# Patient Record
Sex: Female | Born: 1975
Health system: Southern US, Community
[De-identification: ages and names within clinical notes are randomized; demographics above are authoritative.]

## PROBLEM LIST (undated history)

## (undated) DIAGNOSIS — K219 Gastro-esophageal reflux disease without esophagitis: Secondary | ICD-10-CM

## (undated) DIAGNOSIS — D649 Anemia, unspecified: Secondary | ICD-10-CM

## (undated) DIAGNOSIS — R51 Headache: Secondary | ICD-10-CM

## (undated) HISTORY — PX: NASAL ENDOSCOPY: SHX286

---

## 2004-01-15 ENCOUNTER — Other Ambulatory Visit: Admission: RE | Admit: 2004-01-15 | Discharge: 2004-01-15 | Payer: Self-pay | Admitting: Gynecology

## 2004-08-06 ENCOUNTER — Inpatient Hospital Stay (HOSPITAL_COMMUNITY): Admission: AD | Admit: 2004-08-06 | Discharge: 2004-08-09 | Payer: Self-pay | Admitting: Gynecology

## 2004-08-07 ENCOUNTER — Encounter (INDEPENDENT_AMBULATORY_CARE_PROVIDER_SITE_OTHER): Payer: Self-pay | Admitting: *Deleted

## 2004-09-23 ENCOUNTER — Other Ambulatory Visit: Admission: RE | Admit: 2004-09-23 | Discharge: 2004-09-23 | Payer: Self-pay | Admitting: Gynecology

## 2005-09-29 ENCOUNTER — Other Ambulatory Visit: Admission: RE | Admit: 2005-09-29 | Discharge: 2005-09-29 | Payer: Self-pay | Admitting: Gynecology

## 2006-11-12 ENCOUNTER — Emergency Department (HOSPITAL_COMMUNITY): Admission: EM | Admit: 2006-11-12 | Discharge: 2006-11-12 | Payer: Self-pay | Admitting: Emergency Medicine

## 2007-09-23 ENCOUNTER — Inpatient Hospital Stay (HOSPITAL_COMMUNITY): Admission: AD | Admit: 2007-09-23 | Discharge: 2007-09-25 | Payer: Self-pay | Admitting: Obstetrics and Gynecology
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
## 2010-08-01 ENCOUNTER — Ambulatory Visit (HOSPITAL_COMMUNITY): Admission: RE | Admit: 2010-08-01 | Payer: Self-pay | Source: Ambulatory Visit | Admitting: Otolaryngology

## 2010-08-22 NOTE — H&P (Signed)
Peggy Sherman, Peggy Sherman             ACCOUNT NO.:  [id number]   MEDICAL RECORD NO.:  [id number]          PATIENT TYPE:  MAT   LOCATION:  [facility]                          FACILITY:  [facility]   PHYSICIAN:  Juan H. Lily Peer, M.D.DATE OF BIRTH:  June 13, 1975   DATE OF ADMISSION:  DATE OF DISCHARGE:                                HISTORY & PHYSICAL   CHIEF COMPLAINT:  Post date pregnancy, abdominal discomfort.   HISTORY:  The patient is a 35 year old gravida 1, para 0 with a last  menstrual period uncertain but with a corrected estimated date of  confinement August 02, 2004.  The patient currently 40 and 4/[redacted] week  gestation.  Was seen in the office today for her routine prenatal visit.  The patient quite anxious, complaining of abdominal discomfort and irregular  contractions.  Her cervix was 1 cm, 50% effaced, -3 station.  Review of her  prenatal history demonstrated she had an uneventful prenatal course with the  exception of iron deficiency anemia.  Had an abnormal diabetes screen but a  normal followup 3-hour GTT.   PAST MEDICAL HISTORY:  She DENIES ANY ALLERGIES, no smoking or alcohol  consumption, otherwise healthy, on prenatal vitamins.   REVIEW OF SYSTEMS:  __________.   PHYSICAL EXAM:  Her blood pressure today was 128/70, urine was negative for  protein and glucose, weight 183 pounds.  HEENT:  Unremarkable.  NECK:  Supple, trachea midline.  No carotid bruits, no thyromegaly.  LUNGS:  Clear to auscultation without rhonchi or wheezes.  HEART:  Regular rate and rhythm, no murmurs or gallops.  BREAST EXAM:  Not done.  ABDOMEN:  Soft, nontender.  Gravid uterus.  Vertex presentation by [facility]  maneuver.  PELVIC EXAM:  Cervix 1 cm, 50% effaced, -3 station.  EXTREMITIES:  DTR 1+, negative clonus, trace edema.   PRENATAL LABS:  O positive blood type, negative antibody screen.  VDRL was  nonreactive, Rubella immune.  Hepatitis B Surface Antigen and HIV were  negative.  Pap smear was normal.   Diabetes screen as described above.  Patient with iron deficiency anemia, hemoglobin was 11.7, and a GBS culture  was negative and Pap smear was normal.   ASSESSMENT:  A 35 year old gravida 1, para 0, at 63 and 4/[redacted] weeks gestation,  anxious and being post dates and having irregular contractions.  Patient  cervix 1 cm, 50% effaced, -3 station.  She was offered to come in later this  evening for evaluation and monitoring and possible consideration of  admission for induction, such as with Cervidil or pitocin.   PLAN:  As per assessment above.      JHF/MEDQ  D:  08/06/2004  T:  08/06/2004  Job:  [id number]

## 2011-01-01 LAB — CBC
Hemoglobin: 12.4
MCHC: 33.8
MCHC: 34.4
MCV: 91.2
MCV: 92.2
Platelets: 230
RBC: 3.91
RBC: 4.56
RDW: 12.9
WBC: 9.2

## 2011-01-01 LAB — RPR: RPR Ser Ql: NONREACTIVE

## 2011-01-19 LAB — POCT PREGNANCY, URINE: Operator id: 28425

## 2012-02-02 ENCOUNTER — Other Ambulatory Visit (HOSPITAL_COMMUNITY): Payer: Self-pay | Admitting: Otolaryngology

## 2012-03-07 ENCOUNTER — Encounter (HOSPITAL_COMMUNITY): Payer: Self-pay

## 2012-03-10 ENCOUNTER — Encounter (HOSPITAL_COMMUNITY)
Admission: RE | Admit: 2012-03-10 | Discharge: 2012-03-10 | Disposition: A | Discharge status: Home / Self Care | Payer: 59 | Source: Ambulatory Visit | Attending: Otolaryngology | Admitting: Otolaryngology

## 2012-03-10 ENCOUNTER — Encounter (HOSPITAL_COMMUNITY): Payer: Self-pay

## 2012-03-10 HISTORY — DX: Headache: R51

## 2012-03-10 HISTORY — DX: Gastro-esophageal reflux disease without esophagitis: K21.9

## 2012-03-10 HISTORY — DX: Anemia, unspecified: D64.9

## 2012-03-10 LAB — CBC
HCT: 37.7 % (ref 36.0–46.0)
Hemoglobin: 13.1 g/dL (ref 12.0–15.0)
MCH: 30 pg (ref 26.0–34.0)
MCHC: 34.7 g/dL (ref 30.0–36.0)
RBC: 4.36 MIL/uL (ref 3.87–5.11)

## 2012-03-10 NOTE — Pre-Procedure Instructions (Signed)
20 Peggy Sherman  03/10/2012   Your procedure is scheduled on:  Friday, December 13th  Report to [facility] at 0945 AM.  Call this number if you have problems the morning of surgery: [phone]   Remember:   Do not eat food or drink:After Midnight.   Take these medicines the morning of surgery with A SIP OF WATER: protonix   Do not wear jewelry, make-up or nail polish.  Do not wear lotions, powders, or perfumes.   Do not shave 48 hours prior to surgery. Men may shave face and neck.  Do not bring valuables to the hospital.  Contacts, dentures or bridgework may not be worn into surgery.  Leave suitcase in the car. After surgery it may be brought to your room.  For patients admitted to the hospital, checkout time is 11:00 AM the day of discharge.   Patients discharged the day of surgery will not be allowed to drive home.    Special Instructions: Shower using CHG 2 nights before surgery and the night before surgery.  If you shower the day of surgery use CHG.  Use special wash - you have one bottle of CHG for all showers.  You should use approximately 1/3 of the bottle for each shower.   Please read over the following fact sheets that you were given: Pain Booklet, Coughing and Deep Breathing, MRSA Information and Surgical Site Infection Prevention

## 2012-03-10 NOTE — Progress Notes (Signed)
Pt. Denies all resp. & cardiac problems, never seen by cardiologist.

## 2012-03-18 ENCOUNTER — Encounter (HOSPITAL_COMMUNITY)
Admission: RE | Disposition: A | Discharge status: Home / Self Care | Payer: Self-pay | Source: Ambulatory Visit | Attending: Otolaryngology

## 2012-03-18 ENCOUNTER — Ambulatory Visit (HOSPITAL_COMMUNITY): Payer: 59 | Admitting: Anesthesiology

## 2012-03-18 ENCOUNTER — Ambulatory Visit (HOSPITAL_COMMUNITY)
Admission: RE | Admit: 2012-03-18 | Discharge: 2012-03-18 | Disposition: A | Discharge status: Home / Self Care | Payer: 59 | Source: Ambulatory Visit | Attending: Otolaryngology | Admitting: Otolaryngology

## 2012-03-18 ENCOUNTER — Encounter (HOSPITAL_COMMUNITY): Payer: Self-pay | Admitting: Anesthesiology

## 2012-03-18 ENCOUNTER — Encounter (HOSPITAL_COMMUNITY): Payer: Self-pay | Admitting: *Deleted

## 2012-03-18 DIAGNOSIS — R490 Dysphonia: Secondary | ICD-10-CM | POA: Insufficient documentation

## 2012-03-18 DIAGNOSIS — K219 Gastro-esophageal reflux disease without esophagitis: Secondary | ICD-10-CM | POA: Insufficient documentation

## 2012-03-18 HISTORY — PX: LARYNGOSCOPY: SHX5203

## 2012-03-18 SURGERY — LARYNGOSCOPY
Anesthesia: General | Site: Throat | Wound class: Clean Contaminated

## 2012-03-18 MED ORDER — LIDOCAINE HCL 4 % MT SOLN
OROMUCOSAL | Status: DC | PRN
  Administered 2012-03-18: 4 mL via TOPICAL

## 2012-03-18 MED ORDER — SUCCINYLCHOLINE CHLORIDE 20 MG/ML IJ SOLN
250.0000 mg | INTRAVENOUS | Status: DC | PRN
  Administered 2012-03-18: 2.5 mg/min via INTRAVENOUS

## 2012-03-18 MED ORDER — PROPOFOL INFUSION 10 MG/ML OPTIME
INTRAVENOUS | Status: DC | PRN
  Administered 2012-03-18: 140 ug/kg/min via INTRAVENOUS

## 2012-03-18 MED ORDER — 0.9 % SODIUM CHLORIDE (POUR BTL) OPTIME
TOPICAL | Status: DC | PRN
  Administered 2012-03-18: 1000 mL

## 2012-03-18 MED ORDER — ONDANSETRON HCL 4 MG/2ML IJ SOLN
INTRAMUSCULAR | Status: DC | PRN
  Administered 2012-03-18: 4 mg via INTRAVENOUS

## 2012-03-18 MED ORDER — HYDROMORPHONE HCL PF 1 MG/ML IJ SOLN
INTRAMUSCULAR | Status: AC
  Filled 2012-03-18: qty 1

## 2012-03-18 MED ORDER — PROPOFOL 10 MG/ML IV BOLUS
INTRAVENOUS | Status: DC | PRN
  Administered 2012-03-18: 200 mg via INTRAVENOUS

## 2012-03-18 MED ORDER — OXYMETAZOLINE HCL 0.05 % NA SOLN
NASAL | Status: AC
  Filled 2012-03-18: qty 15

## 2012-03-18 MED ORDER — SUCCINYLCHOLINE CHLORIDE 20 MG/ML IJ SOLN
INTRAMUSCULAR | Status: DC | PRN
  Administered 2012-03-18: 100 mg via INTRAVENOUS

## 2012-03-18 MED ORDER — DEXAMETHASONE SODIUM PHOSPHATE 4 MG/ML IJ SOLN
INTRAMUSCULAR | Status: DC | PRN
  Administered 2012-03-18: 8 mg via INTRAVENOUS

## 2012-03-18 MED ORDER — LIDOCAINE HCL (CARDIAC) 20 MG/ML IV SOLN
INTRAVENOUS | Status: DC | PRN
  Administered 2012-03-18: 80 mg via INTRAVENOUS

## 2012-03-18 MED ORDER — EPINEPHRINE HCL (NASAL) 0.1 % NA SOLN
NASAL | Status: AC
  Filled 2012-03-18: qty 30

## 2012-03-18 MED ORDER — LACTATED RINGERS IV SOLN
INTRAVENOUS | Status: DC
  Administered 2012-03-18: 11:00:00 via INTRAVENOUS

## 2012-03-18 MED ORDER — HYDROMORPHONE HCL PF 1 MG/ML IJ SOLN
0.2500 mg | INTRAMUSCULAR | Status: DC | PRN
  Administered 2012-03-18: 0.5 mg via INTRAVENOUS

## 2012-03-18 MED ORDER — MIDAZOLAM HCL 5 MG/5ML IJ SOLN
INTRAMUSCULAR | Status: DC | PRN
  Administered 2012-03-18: 2 mg via INTRAVENOUS

## 2012-03-18 MED ORDER — HYDROCODONE-ACETAMINOPHEN 5-325 MG PO TABS: 1.0000 | ORAL_TABLET | Freq: Four times a day (QID) | ORAL | Status: DC | PRN

## 2012-03-18 SURGICAL SUPPLY — 26 items
BALLN PULM 15 16.5 18X75 (BALLOONS) ×2
BALLOON PULM 15 16.5 18X75 (BALLOONS) IMPLANT
BIT DRILL QC 2.7MM (BIT) IMPLANT
CANISTER SUCTION 2500CC (MISCELLANEOUS) ×2 IMPLANT
CLOTH BEACON ORANGE TIMEOUT ST (SAFETY) ×2 IMPLANT
COVER MAYO STAND STRL (DRAPES) ×2 IMPLANT
COVER TABLE BACK 60X90 (DRAPES) ×2 IMPLANT
CRADLE DONUT ADULT HEAD (MISCELLANEOUS) ×1 IMPLANT
DRAPE PROXIMA HALF (DRAPES) ×2 IMPLANT
GAUZE SPONGE 4X4 16PLY XRAY LF (GAUZE/BANDAGES/DRESSINGS) ×2 IMPLANT
GLOVE BIO SURGEON STRL SZ7.5 (GLOVE) ×2 IMPLANT
GLOVE ECLIPSE 6.5 STRL STRAW (GLOVE) ×1 IMPLANT
GLOVE SURG SS PI 6.5 STRL IVOR (GLOVE) ×1 IMPLANT
GUARD TEETH (MISCELLANEOUS) ×1 IMPLANT
KIT BASIN OR (CUSTOM PROCEDURE TRAY) ×2 IMPLANT
KIT PROLARN PLUS GEL W/NDL (Prosthesis and Implant ENT) ×1 IMPLANT
KIT ROOM TURNOVER OR (KITS) ×2 IMPLANT
NS IRRIG 1000ML POUR BTL (IV SOLUTION) ×2 IMPLANT
PAD ARMBOARD 7.5X6 YLW CONV (MISCELLANEOUS) ×4 IMPLANT
PATTIES SURGICAL .5 X.5 (GAUZE/BANDAGES/DRESSINGS) ×2 IMPLANT
PATTIES SURGICAL .5 X3 (DISPOSABLE) ×1 IMPLANT
PROS DRILL BIT QC 2.7MM (BIT) ×2
SYR INFLATE BILIARY GAUGE (MISCELLANEOUS) ×1 IMPLANT
TOWEL OR 17X24 6PK STRL BLUE (TOWEL DISPOSABLE) ×4 IMPLANT
TUBE CONNECTING 12X1/4 (SUCTIONS) ×2 IMPLANT
WATER STERILE IRR 1000ML POUR (IV SOLUTION) ×2 IMPLANT

## 2012-03-18 NOTE — Anesthesia Preprocedure Evaluation (Signed)
Anesthesia Evaluation  Patient identified by MRN, date of birth, ID band Patient awake    Reviewed: Allergy & Precautions, H&P , NPO status , Patient's Chart, lab work & pertinent test results  Airway Mallampati: II      Dental   Pulmonary neg pulmonary ROS,  breath sounds clear to auscultation        Cardiovascular negative cardio ROS  Rhythm:Regular Rate:Normal     Neuro/Psych    GI/Hepatic Neg liver ROS, GERD-  ,  Endo/Other  negative endocrine ROS  Renal/GU negative Renal ROS     Musculoskeletal   Abdominal   Peds  Hematology   Anesthesia Other Findings   Reproductive/Obstetrics                           Anesthesia Physical Anesthesia Plan  ASA: II  Anesthesia Plan: General   Post-op Pain Management:    Induction: Intravenous  Airway Management Planned: Oral ETT  Additional Equipment:   Intra-op Plan:   Post-operative Plan: Extubation in OR  Informed Consent:   Dental advisory given  Plan Discussed with: CRNA, Anesthesiologist and Surgeon  Anesthesia Plan Comments:         Anesthesia Quick Evaluation

## 2012-03-18 NOTE — Anesthesia Postprocedure Evaluation (Signed)
  Anesthesia Post-op Note  Patient: Peggy Sherman  Procedure(s) Performed: Procedure(s) (LRB) with comments: LARYNGOSCOPY (N/A) - Micro direct laryngoscopy with bilateral radiesse injections/jet venturi ventilation  Patient Location: PACU  Anesthesia Type:General  Level of Consciousness: awake  Airway and Oxygen Therapy: Patient Spontanous Breathing  Post-op Pain: mild  Post-op Assessment: Post-op Vital signs reviewed  Post-op Vital Signs: Reviewed  Complications: No apparent anesthesia complications

## 2012-03-18 NOTE — Brief Op Note (Signed)
03/18/2012  11:50 AM  PATIENT:  Peggy Sherman  36 y.o. female  PRE-OPERATIVE DIAGNOSIS:  Dysphonia  POST-OPERATIVE DIAGNOSIS:  Dysphonia  PROCEDURE:  Procedure(s) (LRB) with comments: LARYNGOSCOPY (N/A) - Micro direct laryngoscopy with bilateral radiesse injections/jet venturi ventilation  SURGEON:  Surgeon(s) and Role:    * Christia Reading, MD - Primary  PHYSICIAN ASSISTANT:   ASSISTANTS: none   ANESTHESIA:   general  EBL:     BLOOD ADMINISTERED:none  DRAINS: none   LOCAL MEDICATIONS USED:  LIDOCAINE   SPECIMEN:  No Specimen  DISPOSITION OF SPECIMEN:  N/A  COUNTS:  YES  TOURNIQUET:  * No tourniquets in log *  DICTATION: .Other Dictation: Dictation Number [id number]  PLAN OF CARE: Discharge to home after PACU  PATIENT DISPOSITION:  PACU - hemodynamically stable.   Delay start of Pharmacological VTE agent (>24hrs) due to surgical blood loss or risk of bleeding: no

## 2012-03-18 NOTE — Op Note (Signed)
NAMESTEPAHNIE, CAMPO             ACCOUNT NO.:  [id number]  MEDICAL RECORD NO.:  [id number]  LOCATION:  [facility]                         FACILITY:  [facility]  PHYSICIAN:  Antony Contras, MD     DATE OF BIRTH:  [birth year]  DATE OF PROCEDURE:  03/18/2012 DATE OF DISCHARGE:  03/18/2012                              OPERATIVE REPORT   PREOPERATIVE DIAGNOSIS:  Hoarseness.  POSTOPERATIVE DIAGNOSIS:  Hoarseness.  PROCEDURE:  Suspended microdirect laryngoscopy with bilateral Radiesse injections.  SURGEON:  Antony Contras, MD  ANESTHESIA:  General jet Venturi ventilation.  COMPLICATIONS:  None.  INDICATION:  The patient is a 36 year old female who has a several-year history of intermittent hoarseness that is worse with more talking and associated with vocal fatigue.  She was found to have both vocal folds and has decided to proceed with a vocal fold injection to try to improve her voice.  FINDINGS:  The vocal folds were found to be normal except for some bowing.  There was a little bit of adherent mucus seen as well.  There was no mass or tumor or cyst or other lesions.  0.25 mL of Radiesse was injected in the left vocal fold and 0.20 mL was injected in the right vocal fold.  DESCRIPTION OF PROCEDURE:  The patient was identified in the holding room and informed consent having been obtained including discussion of risks, benefits, alternatives, the patient was brought to the operative suite and put on the operating table in a supine position.  Anesthesia was induced.  The patient was maintained via mask ventilation.  The eyes were taped closed and bed was turned 90 degrees from anesthesia.  The patient was given intravenous steroids during the case.  Once under anesthesia, a tooth guard was placed and a Storz laryngoscope was inserted and placed in a supraglottic position.  This was put in suspension on the Mayo stand using a Lewy arm.  Jet Venturi ventilation was then initiated and  maintained through the case.  A preoperative photograph was made with a 0-degree telescope.  Radiesse was then injected into the vocal folds starting on the left side with 0.15 mL injected posteriorly and then going to the right side with the same amount injected posteriorly.  An additional more anterior injection was placed on both sides with 0.1 mL on the left and 0.05 on the right. This resulted in a symmetric-appearing medialization.  The area was suctioned and a postoperative photograph was made with a 0-degree telescope.  The larynx was sprayed with topical lidocaine.  The jet Venturi ventilation was stopped and the laryngoscope was taken out of suspension and removed from the patient's mouth.  This was done while suctioning the airway.  The tooth guard was removed, and the patient was returned to mask ventilation.  She was turned back to Anesthesia for wake up and was moved to the recovery room in stable condition.     Antony Contras, MD     DDB/MEDQ  D:  03/18/2012  T:  03/18/2012  Job:  [id number]

## 2012-03-18 NOTE — H&P (Signed)
Peggy Sherman is an 36 y.o. female.   Chief Complaint: hoarseness HPI: 36 year old with a few years of intermittent hoarseness that worsens with more talking.  Found to have bowing of the vocal folds.  Past Medical History  Diagnosis Date  . GERD (gastroesophageal reflux disease)   . Headache     migraines - excedrin migraine & imitrex- as needed    . Anemia     /w preg.     Past Surgical History  Procedure Date  . Vaginal delivery     2006 & 2009  . Nasal endoscopy     History reviewed. No pertinent family history. Social History:  reports that she has never smoked. She does not have any smokeless tobacco history on file. She reports that she drinks alcohol. She reports that she does not use illicit drugs.  Allergies: No Known Allergies  Medications Prior to Admission  Medication Sig Dispense Refill  . etonogestrel-ethinyl estradiol (NUVARING) 0.12-0.015 MG/24HR vaginal ring Place 1 each vaginally every 28 (twenty-eight) days. Insert vaginally and leave in place for 3 consecutive weeks, then remove for 1 week.      . Multiple Vitamins-Minerals (MULTIVITAMINS) CHEW Chew 1 tablet by mouth daily.      . pantoprazole (PROTONIX) 40 MG tablet Take 40 mg by mouth 2 (two) times daily.        No results found for this or any previous visit (from the past 48 hour(s)). No results found.  Review of Systems  All other systems reviewed and are negative.    Blood pressure 118/72, pulse 58, temperature 98 F (36.7 C), temperature source Oral, resp. rate 18, last menstrual period 01/05/2012, SpO2 99.00%. Physical Exam  Constitutional: She is oriented to person, place, and time. She appears well-developed and well-nourished. No distress.  HENT:  Head: Normocephalic and atraumatic.  Right Ear: External ear normal.  Left Ear: External ear normal.  Nose: Nose normal.  Mouth/Throat: Oropharynx is clear and moist.  Eyes: Conjunctivae normal and EOM are normal. Pupils are equal, round,  and reactive to light.  Neck: Normal range of motion. Neck supple.  Cardiovascular: Normal rate.   Respiratory: Effort normal.  GI:       Did not examine.  Genitourinary:       Did not examine.  Musculoskeletal: Normal range of motion.  Neurological: She is alert and oriented to person, place, and time. No cranial nerve deficit.  Skin: Skin is warm and dry.  Psychiatric: She has a normal mood and affect. Her behavior is normal. Judgment and thought content normal.     Assessment/Plan Hoarseness, vocal fold atrophy To OR for laryngoscopy with Radiesse injections.  Jontez Redfield 03/18/2012, 11:10 AM

## 2012-03-18 NOTE — Transfer of Care (Signed)
Immediate Anesthesia Transfer of Care Note  Patient: Peggy Sherman  Procedure(s) Performed: Procedure(s) (LRB) with comments: LARYNGOSCOPY (N/A) - Micro direct laryngoscopy with bilateral radiesse injections/jet venturi ventilation  Patient Location: PACU  Anesthesia Type:General  Level of Consciousness: awake, alert  and oriented  Airway & Oxygen Therapy: Patient Spontanous Breathing  Post-op Assessment: Report given to PACU RN and Post -op Vital signs reviewed and stable  Post vital signs: Reviewed and stable  Complications: No apparent anesthesia complications

## 2012-03-18 NOTE — Progress Notes (Signed)
PT HAD ONE SHORT EVENT OF NAUSEA ...BECAME PALE ... HOB LOWERED AND COOL CLOTH TO  FACE .Marland KitchenMarland Kitchen AND PT FEELS OK AT THIS TIME.

## 2012-03-21 ENCOUNTER — Encounter (HOSPITAL_COMMUNITY): Payer: Self-pay | Admitting: Otolaryngology

## 2016-05-18 DIAGNOSIS — R0781 Pleurodynia: Secondary | ICD-10-CM | POA: Diagnosis not present

## 2016-05-18 DIAGNOSIS — S299XXA Unspecified injury of thorax, initial encounter: Secondary | ICD-10-CM | POA: Diagnosis not present

## 2016-07-03 DIAGNOSIS — M222X2 Patellofemoral disorders, left knee: Secondary | ICD-10-CM | POA: Diagnosis not present

## 2016-07-03 DIAGNOSIS — R600 Localized edema: Secondary | ICD-10-CM | POA: Diagnosis not present

## 2016-07-03 DIAGNOSIS — M25562 Pain in left knee: Secondary | ICD-10-CM | POA: Diagnosis not present

## 2016-07-13 DIAGNOSIS — M25562 Pain in left knee: Secondary | ICD-10-CM | POA: Diagnosis not present

## 2016-07-13 DIAGNOSIS — M222X2 Patellofemoral disorders, left knee: Secondary | ICD-10-CM | POA: Diagnosis not present

## 2016-07-13 DIAGNOSIS — R600 Localized edema: Secondary | ICD-10-CM | POA: Diagnosis not present

## 2016-07-23 DIAGNOSIS — M222X2 Patellofemoral disorders, left knee: Secondary | ICD-10-CM | POA: Diagnosis not present

## 2016-07-23 DIAGNOSIS — M25562 Pain in left knee: Secondary | ICD-10-CM | POA: Diagnosis not present

## 2016-07-23 DIAGNOSIS — R600 Localized edema: Secondary | ICD-10-CM | POA: Diagnosis not present

## 2016-08-11 DIAGNOSIS — R600 Localized edema: Secondary | ICD-10-CM | POA: Diagnosis not present

## 2016-08-11 DIAGNOSIS — M222X2 Patellofemoral disorders, left knee: Secondary | ICD-10-CM | POA: Diagnosis not present

## 2016-08-11 DIAGNOSIS — M25562 Pain in left knee: Secondary | ICD-10-CM | POA: Diagnosis not present

## 2016-09-03 DIAGNOSIS — Z01419 Encounter for gynecological examination (general) (routine) without abnormal findings: Secondary | ICD-10-CM | POA: Diagnosis not present

## 2017-01-20 DIAGNOSIS — M9901 Segmental and somatic dysfunction of cervical region: Secondary | ICD-10-CM | POA: Diagnosis not present

## 2017-01-20 DIAGNOSIS — M9902 Segmental and somatic dysfunction of thoracic region: Secondary | ICD-10-CM | POA: Diagnosis not present

## 2017-01-20 DIAGNOSIS — M7912 Myalgia of auxiliary muscles, head and neck: Secondary | ICD-10-CM | POA: Diagnosis not present

## 2017-01-27 DIAGNOSIS — M9902 Segmental and somatic dysfunction of thoracic region: Secondary | ICD-10-CM | POA: Diagnosis not present

## 2017-01-27 DIAGNOSIS — M9901 Segmental and somatic dysfunction of cervical region: Secondary | ICD-10-CM | POA: Diagnosis not present

## 2017-01-27 DIAGNOSIS — M7912 Myalgia of auxiliary muscles, head and neck: Secondary | ICD-10-CM | POA: Diagnosis not present

## 2017-01-29 DIAGNOSIS — M7912 Myalgia of auxiliary muscles, head and neck: Secondary | ICD-10-CM | POA: Diagnosis not present

## 2017-01-29 DIAGNOSIS — M9901 Segmental and somatic dysfunction of cervical region: Secondary | ICD-10-CM | POA: Diagnosis not present

## 2017-01-29 DIAGNOSIS — M9902 Segmental and somatic dysfunction of thoracic region: Secondary | ICD-10-CM | POA: Diagnosis not present

## 2017-02-03 DIAGNOSIS — M9902 Segmental and somatic dysfunction of thoracic region: Secondary | ICD-10-CM | POA: Diagnosis not present

## 2017-02-03 DIAGNOSIS — M9901 Segmental and somatic dysfunction of cervical region: Secondary | ICD-10-CM | POA: Diagnosis not present

## 2017-02-03 DIAGNOSIS — M7912 Myalgia of auxiliary muscles, head and neck: Secondary | ICD-10-CM | POA: Diagnosis not present

## 2017-02-08 DIAGNOSIS — E559 Vitamin D deficiency, unspecified: Secondary | ICD-10-CM | POA: Diagnosis not present

## 2017-02-08 DIAGNOSIS — Z0000 Encounter for general adult medical examination without abnormal findings: Secondary | ICD-10-CM | POA: Diagnosis not present

## 2017-02-08 DIAGNOSIS — E7800 Pure hypercholesterolemia, unspecified: Secondary | ICD-10-CM | POA: Diagnosis not present

## 2017-02-10 DIAGNOSIS — K219 Gastro-esophageal reflux disease without esophagitis: Secondary | ICD-10-CM | POA: Diagnosis not present

## 2017-02-10 DIAGNOSIS — Z0000 Encounter for general adult medical examination without abnormal findings: Secondary | ICD-10-CM | POA: Diagnosis not present

## 2017-02-10 DIAGNOSIS — G43909 Migraine, unspecified, not intractable, without status migrainosus: Secondary | ICD-10-CM | POA: Diagnosis not present

## 2017-02-15 DIAGNOSIS — M7912 Myalgia of auxiliary muscles, head and neck: Secondary | ICD-10-CM | POA: Diagnosis not present

## 2017-02-15 DIAGNOSIS — M9902 Segmental and somatic dysfunction of thoracic region: Secondary | ICD-10-CM | POA: Diagnosis not present

## 2017-02-15 DIAGNOSIS — M9901 Segmental and somatic dysfunction of cervical region: Secondary | ICD-10-CM | POA: Diagnosis not present

## 2017-02-18 DIAGNOSIS — R7989 Other specified abnormal findings of blood chemistry: Secondary | ICD-10-CM | POA: Diagnosis not present

## 2017-03-03 DIAGNOSIS — M9902 Segmental and somatic dysfunction of thoracic region: Secondary | ICD-10-CM | POA: Diagnosis not present

## 2017-03-03 DIAGNOSIS — M7912 Myalgia of auxiliary muscles, head and neck: Secondary | ICD-10-CM | POA: Diagnosis not present

## 2017-03-03 DIAGNOSIS — M9901 Segmental and somatic dysfunction of cervical region: Secondary | ICD-10-CM | POA: Diagnosis not present

## 2017-03-25 DIAGNOSIS — M9902 Segmental and somatic dysfunction of thoracic region: Secondary | ICD-10-CM | POA: Diagnosis not present

## 2017-03-25 DIAGNOSIS — M7912 Myalgia of auxiliary muscles, head and neck: Secondary | ICD-10-CM | POA: Diagnosis not present

## 2017-03-25 DIAGNOSIS — M9901 Segmental and somatic dysfunction of cervical region: Secondary | ICD-10-CM | POA: Diagnosis not present

## 2017-05-05 DIAGNOSIS — M9902 Segmental and somatic dysfunction of thoracic region: Secondary | ICD-10-CM | POA: Diagnosis not present

## 2017-05-05 DIAGNOSIS — M9901 Segmental and somatic dysfunction of cervical region: Secondary | ICD-10-CM | POA: Diagnosis not present

## 2017-05-05 DIAGNOSIS — G44209 Tension-type headache, unspecified, not intractable: Secondary | ICD-10-CM | POA: Diagnosis not present

## 2017-05-17 DIAGNOSIS — J209 Acute bronchitis, unspecified: Secondary | ICD-10-CM | POA: Diagnosis not present

## 2017-06-16 DIAGNOSIS — G44209 Tension-type headache, unspecified, not intractable: Secondary | ICD-10-CM | POA: Diagnosis not present

## 2017-06-16 DIAGNOSIS — M9902 Segmental and somatic dysfunction of thoracic region: Secondary | ICD-10-CM | POA: Diagnosis not present

## 2017-06-16 DIAGNOSIS — M9901 Segmental and somatic dysfunction of cervical region: Secondary | ICD-10-CM | POA: Diagnosis not present

## 2017-08-04 DIAGNOSIS — G44209 Tension-type headache, unspecified, not intractable: Secondary | ICD-10-CM | POA: Diagnosis not present

## 2017-08-04 DIAGNOSIS — M9901 Segmental and somatic dysfunction of cervical region: Secondary | ICD-10-CM | POA: Diagnosis not present

## 2017-08-04 DIAGNOSIS — M9902 Segmental and somatic dysfunction of thoracic region: Secondary | ICD-10-CM | POA: Diagnosis not present

## 2017-09-15 DIAGNOSIS — M9901 Segmental and somatic dysfunction of cervical region: Secondary | ICD-10-CM | POA: Diagnosis not present

## 2017-09-15 DIAGNOSIS — M9902 Segmental and somatic dysfunction of thoracic region: Secondary | ICD-10-CM | POA: Diagnosis not present

## 2017-09-15 DIAGNOSIS — G44209 Tension-type headache, unspecified, not intractable: Secondary | ICD-10-CM | POA: Diagnosis not present

## 2017-10-12 DIAGNOSIS — Z01419 Encounter for gynecological examination (general) (routine) without abnormal findings: Secondary | ICD-10-CM | POA: Diagnosis not present

## 2018-01-04 DIAGNOSIS — M9902 Segmental and somatic dysfunction of thoracic region: Secondary | ICD-10-CM | POA: Diagnosis not present

## 2018-01-04 DIAGNOSIS — M9901 Segmental and somatic dysfunction of cervical region: Secondary | ICD-10-CM | POA: Diagnosis not present

## 2018-01-04 DIAGNOSIS — M9903 Segmental and somatic dysfunction of lumbar region: Secondary | ICD-10-CM | POA: Diagnosis not present

## 2018-01-14 DIAGNOSIS — M9903 Segmental and somatic dysfunction of lumbar region: Secondary | ICD-10-CM | POA: Diagnosis not present

## 2018-01-14 DIAGNOSIS — M9901 Segmental and somatic dysfunction of cervical region: Secondary | ICD-10-CM | POA: Diagnosis not present

## 2018-01-14 DIAGNOSIS — M7632 Iliotibial band syndrome, left leg: Secondary | ICD-10-CM | POA: Diagnosis not present

## 2018-01-31 ENCOUNTER — Ambulatory Visit: Payer: 59 | Admitting: Sports Medicine

## 2018-01-31 ENCOUNTER — Encounter: Payer: Self-pay | Admitting: Sports Medicine

## 2018-01-31 VITALS — BP 132/84 | Ht 64.0 in | Wt 156.0 lb

## 2018-01-31 DIAGNOSIS — M7632 Iliotibial band syndrome, left leg: Secondary | ICD-10-CM | POA: Diagnosis not present

## 2018-01-31 NOTE — Progress Notes (Signed)
   Subjective:    Patient ID: Peggy Sherman, female    DOB: [birth year], 42 y.o.   MRN: [id number]  HPI chief complaint: Left knee pain  Very pleasant 42 year old runner comes in today complaining of 3 weeks of lateral left knee pain.  She does not recall any specific injury but describes a sudden onset of pain that began one day while running.  She has tried resting but her pain will return about 15 minutes into her run.  It is sharp in quality.  She denies pain elsewhere in the knee.  She was initially getting some pain in her hip but that has resolved.  She has not noticed swelling.  No mechanical symptoms.  No feelings of instability.  She has tried chiropractic treatment as well as ice.  She denies similar issues in the past.  She is currently training for a half marathon on November 23.  Past medical history reviewed Medications reviewed Allergies reviewed    Review of Systems  As above    Objective:   Physical Exam  Well-developed, well-nourished.  No acute distress.  Awake alert and oriented x3.  Vital signs reviewed  Left knee: Full range of motion.  No effusion.  Slight tenderness to palpation along the distal IT band but not marked.  No soft tissue swelling.  No joint line tenderness to palpation.  Negative McMurray's.  Negative Thessaly's.  Knee is stable to ligamentous exam.  Neurovascularly intact distally. Hip abductor strength is quite strong.  Gait analysis shows her to be a forefoot striker landing in pronation.  No limp.       Assessment & Plan:   Left knee pain secondary to distal IT band syndrome  Although she has strong hip abductors I am going to go ahead and give her hip abductor strengthening exercises anyway.  She will also start IT band stretching.  She may try an IT band strap or KT tape when running.  Continue icing as needed.  She may need some arch support but she is currently training for a half marathon and she did not bring in her current running  shoes.  We will set up a tentative follow-up appointment for November 25.  If symptoms persist then consider x-rays, formal physical therapy, and arch support for her running shoes.  She may continue to train using pain as her guide and she may continue to run as long as she is not limping or pain is increasing.

## 2018-02-08 DIAGNOSIS — J111 Influenza due to unidentified influenza virus with other respiratory manifestations: Secondary | ICD-10-CM | POA: Diagnosis not present

## 2018-02-09 DIAGNOSIS — J029 Acute pharyngitis, unspecified: Secondary | ICD-10-CM | POA: Diagnosis not present

## 2018-02-23 DIAGNOSIS — M9903 Segmental and somatic dysfunction of lumbar region: Secondary | ICD-10-CM | POA: Diagnosis not present

## 2018-02-23 DIAGNOSIS — M9901 Segmental and somatic dysfunction of cervical region: Secondary | ICD-10-CM | POA: Diagnosis not present

## 2018-02-23 DIAGNOSIS — M7632 Iliotibial band syndrome, left leg: Secondary | ICD-10-CM | POA: Diagnosis not present

## 2018-02-28 ENCOUNTER — Ambulatory Visit: Payer: 59 | Admitting: Sports Medicine

## 2018-03-02 DIAGNOSIS — Z0000 Encounter for general adult medical examination without abnormal findings: Secondary | ICD-10-CM | POA: Diagnosis not present

## 2018-03-02 DIAGNOSIS — E559 Vitamin D deficiency, unspecified: Secondary | ICD-10-CM | POA: Diagnosis not present

## 2018-03-02 DIAGNOSIS — E7800 Pure hypercholesterolemia, unspecified: Secondary | ICD-10-CM | POA: Diagnosis not present

## 2018-03-09 DIAGNOSIS — G43919 Migraine, unspecified, intractable, without status migrainosus: Secondary | ICD-10-CM | POA: Diagnosis not present

## 2018-03-09 DIAGNOSIS — C44519 Basal cell carcinoma of skin of other part of trunk: Secondary | ICD-10-CM | POA: Diagnosis not present

## 2018-03-09 DIAGNOSIS — K219 Gastro-esophageal reflux disease without esophagitis: Secondary | ICD-10-CM | POA: Diagnosis not present

## 2018-03-09 DIAGNOSIS — Z0000 Encounter for general adult medical examination without abnormal findings: Secondary | ICD-10-CM | POA: Diagnosis not present

## 2018-04-18 DIAGNOSIS — M7632 Iliotibial band syndrome, left leg: Secondary | ICD-10-CM | POA: Diagnosis not present

## 2018-04-18 DIAGNOSIS — M9903 Segmental and somatic dysfunction of lumbar region: Secondary | ICD-10-CM | POA: Diagnosis not present

## 2018-04-18 DIAGNOSIS — M9901 Segmental and somatic dysfunction of cervical region: Secondary | ICD-10-CM | POA: Diagnosis not present

## 2018-05-17 DIAGNOSIS — M7632 Iliotibial band syndrome, left leg: Secondary | ICD-10-CM | POA: Diagnosis not present

## 2018-05-17 DIAGNOSIS — M9903 Segmental and somatic dysfunction of lumbar region: Secondary | ICD-10-CM | POA: Diagnosis not present

## 2018-05-17 DIAGNOSIS — M9901 Segmental and somatic dysfunction of cervical region: Secondary | ICD-10-CM | POA: Diagnosis not present

## 2018-06-24 DIAGNOSIS — D225 Melanocytic nevi of trunk: Secondary | ICD-10-CM | POA: Diagnosis not present

## 2018-06-24 DIAGNOSIS — D485 Neoplasm of uncertain behavior of skin: Secondary | ICD-10-CM | POA: Diagnosis not present

## 2018-06-24 DIAGNOSIS — D2261 Melanocytic nevi of right upper limb, including shoulder: Secondary | ICD-10-CM | POA: Diagnosis not present

## 2018-06-24 DIAGNOSIS — L905 Scar conditions and fibrosis of skin: Secondary | ICD-10-CM | POA: Diagnosis not present

## 2018-06-24 DIAGNOSIS — D2262 Melanocytic nevi of left upper limb, including shoulder: Secondary | ICD-10-CM | POA: Diagnosis not present

## 2018-06-24 DIAGNOSIS — C44519 Basal cell carcinoma of skin of other part of trunk: Secondary | ICD-10-CM | POA: Diagnosis not present

## 2018-07-07 DIAGNOSIS — M9903 Segmental and somatic dysfunction of lumbar region: Secondary | ICD-10-CM | POA: Diagnosis not present

## 2018-07-07 DIAGNOSIS — M9901 Segmental and somatic dysfunction of cervical region: Secondary | ICD-10-CM | POA: Diagnosis not present

## 2018-07-07 DIAGNOSIS — M9902 Segmental and somatic dysfunction of thoracic region: Secondary | ICD-10-CM | POA: Diagnosis not present

## 2018-08-19 DIAGNOSIS — M9902 Segmental and somatic dysfunction of thoracic region: Secondary | ICD-10-CM | POA: Diagnosis not present

## 2018-08-19 DIAGNOSIS — M9903 Segmental and somatic dysfunction of lumbar region: Secondary | ICD-10-CM | POA: Diagnosis not present

## 2018-08-19 DIAGNOSIS — M9901 Segmental and somatic dysfunction of cervical region: Secondary | ICD-10-CM | POA: Diagnosis not present

## 2019-06-11 ENCOUNTER — Ambulatory Visit: Payer: 59 | Attending: Internal Medicine

## 2019-06-11 DIAGNOSIS — Z23 Encounter for immunization: Secondary | ICD-10-CM | POA: Insufficient documentation

## 2019-06-11 NOTE — Progress Notes (Signed)
   Covid-19 Vaccination Clinic  Name:  ATAVIA VANBELLE    MRN: [id number] DOB: [birth year]  06/11/2019  Ms. Milbourne was observed post Covid-19 immunization for 15 minutes without incident. She was provided with Vaccine Information Sheet and instruction to access the V-Safe system.   Ms. Caines was instructed to call 911 with any severe reactions post vaccine: Marland Kitchen Difficulty breathing  . Swelling of face and throat  . A fast heartbeat  . A bad rash all over body  . Dizziness and weakness   Immunizations Administered    Name Date Dose VIS Date Route   Pfizer COVID-19 Vaccine 06/11/2019 11:22 AM 0.3 mL 03/17/2019 Intramuscular   Manufacturer: [place]   Lot: [id number]   [place]: [id number]

## 2019-07-04 ENCOUNTER — Ambulatory Visit: Payer: 59 | Attending: Internal Medicine

## 2019-07-04 DIAGNOSIS — Z23 Encounter for immunization: Secondary | ICD-10-CM

## 2019-07-04 NOTE — Progress Notes (Signed)
   Covid-19 Vaccination Clinic  Name:  Peggy Sherman    MRN: [id number] DOB: 1975-04-17  07/04/2019  [name] was observed post Covid-19 immunization for 15 minutes without incident. She was provided with Vaccine Information Sheet and instruction to access the V-Safe system.   [name] was instructed to call 911 with any severe reactions post vaccine: Marland Kitchen Difficulty breathing  . Swelling of face and throat  . A fast heartbeat  . A bad rash all over body  . Dizziness and weakness   Immunizations Administered    Name Date Dose VIS Date Route   Pfizer COVID-19 Vaccine 07/04/2019 12:04 PM 0.3 mL 03/17/2019 Intramuscular   Manufacturer: [place]   Lot: [id number]   [place]: [id number]

## 2019-07-12 ENCOUNTER — Ambulatory Visit: Payer: 59

## 2021-06-23 ENCOUNTER — Encounter: Payer: Self-pay | Admitting: Family Medicine

## 2021-06-23 ENCOUNTER — Ambulatory Visit: Payer: 59 | Admitting: Family Medicine

## 2021-06-23 ENCOUNTER — Ambulatory Visit: Payer: Self-pay

## 2021-06-23 VITALS — BP 128/86 | Ht 64.0 in | Wt 156.0 lb

## 2021-06-23 DIAGNOSIS — M79672 Pain in left foot: Secondary | ICD-10-CM

## 2021-06-23 NOTE — Patient Instructions (Signed)
You have a stress reaction of one of your metatarsals. ?Cross train with your spin bike, cycling, elliptical as long as these are not painful. ?Icing, tylenol, ibuprofen if needed. ?Avoid running between now and the race - this gives you the best chance to be able to complete it though your time won't be what you had hoped for probably. ?You may benefit from an OTC insole like spencos or a cushioned superfeet insole but I would hesitate to add this right before your race since you haven't trained in them - consider after this race though. ?Follow up with me in 4 weeks or as needed if you're doing well. ?Typically these resolve in 2-4 weeks. ?

## 2021-06-23 NOTE — Progress Notes (Signed)
PCP: Pcp, No ? ?Subjective:  ? ?HPI: ?Patient is a 46 y.o. female here for left foot pain. ? ?Patient is an avid runner. ?Had been running about 20 miles a week before current pain started 2 weeks ago. ?Ran 11 miles 2 weeks ago and started to notice pain dorsolateral left foot. ?Pain worse with supination. ?No swelling or bruising. ?Tried KT taping, icing, ibuprofen. ?Has history of likely remote stress fracture. ?Tried to run again on Saturday and felt worse so stopped. ? ?Past Medical History:  ?Diagnosis Date  ? Anemia   ? /w preg.   ? GERD (gastroesophageal reflux disease)   ? Headache(784.0)   ? migraines - excedrin migraine & imitrex- as needed    ? ? ?Current Outpatient Medications on File Prior to Visit  ?Medication Sig Dispense Refill  ? DEXILANT 60 MG capsule Take 60 mg by mouth daily.  1  ? EMGALITY 120 MG/ML SOAJ INJECT '240MG'$  [place] FOR LOADING DOSE THEN '120MG'$  ONCE A MONTH  11  ? etonogestrel-ethinyl estradiol (NUVARING) 0.12-0.015 MG/24HR vaginal ring Place 1 each vaginally every 28 (twenty-eight) days. Insert vaginally and leave in place for 3 consecutive weeks, then remove for 1 week.    ? Multiple Vitamins-Minerals (MULTIVITAMINS) CHEW Chew 1 tablet by mouth daily.    ? pantoprazole (PROTONIX) 40 MG tablet Take 40 mg by mouth 2 (two) times daily.    ? ?No current facility-administered medications on file prior to visit.  ? ? ?Past Surgical History:  ?Procedure Laterality Date  ? LARYNGOSCOPY  03/18/2012  ? Procedure: LARYNGOSCOPY;  Surgeon: Melida Quitter, MD;  Location: [facility];  Service: ENT;  Laterality: N/A;  Micro direct laryngoscopy with bilateral radiesse injections/jet venturi ventilation  ? NASAL ENDOSCOPY    ? VAGINAL DELIVERY    ? 2006 & 2009  ? ? ?No Known Allergies ? ?BP 128/86   Ht '5\' 4"'$  (1.626 m)   Wt 156 lb (70.8 kg)   BMI 26.78 kg/m?  ? ?[place] Adult Exercise 06/23/2021  ?Frequency of aerobic exercise (# of days/week) 7  ?Average time in minutes 45  ?Frequency of  strengthening activities (# of days/week) 4  ? ? ?No flowsheet data found. ? ?    ?Objective:  ?Physical Exam: ? ?Gen: NAD, comfortable in exam room ? ?Left foot/ankle: ?Mild pes planus.  No splaying, transverse arch collapse. ?No gross deformity, swelling, ecchymoses ?FROM digits, ankle without pain.  Normal strength ?No focal TTP. ?Negative ant drawer and negative talar tilt.   ?Negative metatarsal squeeze. ?NV intact distally. ? ?Limited MSK u/s left foot:  No cortical irregularity, edema overlying cortices of metatarsals, neovascularity. ?  ?Assessment & Plan:  ?1. Left foot pain - in runner.  Exam today and ultrasound are reassuring.  History concerning for early metatarsal stress reaction, likely 4th metatarsal.  Icing, tylenol, ibuprofen if needed.  Cross train.  Avoid running until her race on April 1.  Consider insoles.  F/u in 4 weeks or prn. ?

## 2021-07-23 ENCOUNTER — Ambulatory Visit: Payer: 59 | Admitting: Family Medicine

## 2021-07-23 ENCOUNTER — Ambulatory Visit: Payer: Self-pay

## 2021-07-23 VITALS — BP 122/78 | Ht 64.0 in | Wt 156.0 lb

## 2021-07-23 DIAGNOSIS — M79672 Pain in left foot: Secondary | ICD-10-CM

## 2021-07-23 NOTE — Patient Instructions (Signed)
Your ultrasound is reassuring. ?I don't see a morton's neuroma between the metatarsal heads or a stress fracture. ?This is consistent with a stress reaction that's being obnoxious. ?Sports insoles with metatarsal pads. ?Avoid barefoot walking when possible. ?Wait 2-3 weeks before trying to walk/jog though.  Ok to cross train in meantime. ?Follow up with me in about 1 month. ?

## 2021-07-25 ENCOUNTER — Encounter: Payer: Self-pay | Admitting: Family Medicine

## 2021-07-25 NOTE — Progress Notes (Signed)
PCP: Pcp, No ? ?Subjective:  ? ?HPI: ?Patient is a 46 y.o. female here for left foot pain. ? ?3/20: ?Patient is an avid runner. ?Had been running about 20 miles a week before current pain started 2 weeks ago. ?Ran 11 miles 2 weeks ago and started to notice pain dorsolateral left foot. ?Pain worse with supination. ?No swelling or bruising. ?Tried KT taping, icing, ibuprofen. ?Has history of likely remote stress fracture. ?Tried to run again on Saturday and felt worse so stopped. ? ?4/20: ?Patient reports her pain is about the same as last visit. ?She has been able to walk ok. ?Took 1.5 weeks off before her race but still unable to run more than a few miles before she stopped and walked back to her car. ?Then took another 2 weeks off and did walk/run on Sunday but pain still bothering her dorsal left foot. ?Feels about 2nd and 3rd metatarsals. ?Saw chiropractor and pain more localized between the metatarsals at that visit. ? ?Past Medical History:  ?Diagnosis Date  ? Anemia   ? /w preg.   ? GERD (gastroesophageal reflux disease)   ? Headache(784.0)   ? migraines - excedrin migraine & imitrex- as needed    ? ? ?Current Outpatient Medications on File Prior to Visit  ?Medication Sig Dispense Refill  ? DEXILANT 60 MG capsule Take 60 mg by mouth daily.  1  ? EMGALITY 120 MG/ML SOAJ INJECT '240MG'$  [place] FOR LOADING DOSE THEN '120MG'$  ONCE A MONTH  11  ? etonogestrel-ethinyl estradiol (NUVARING) 0.12-0.015 MG/24HR vaginal ring Place 1 each vaginally every 28 (twenty-eight) days. Insert vaginally and leave in place for 3 consecutive weeks, then remove for 1 week.    ? Multiple Vitamins-Minerals (MULTIVITAMINS) CHEW Chew 1 tablet by mouth daily.    ? pantoprazole (PROTONIX) 40 MG tablet Take 40 mg by mouth 2 (two) times daily.    ? ?No current facility-administered medications on file prior to visit.  ? ? ?Past Surgical History:  ?Procedure Laterality Date  ? LARYNGOSCOPY  03/18/2012  ? Procedure: LARYNGOSCOPY;  Surgeon: Melida Quitter,  MD;  Location: [facility];  Service: ENT;  Laterality: N/A;  Micro direct laryngoscopy with bilateral radiesse injections/jet venturi ventilation  ? NASAL ENDOSCOPY    ? VAGINAL DELIVERY    ? 2006 & 2009  ? ? ?No Known Allergies ? ?BP 122/78   Ht '5\' 4"'$  (1.626 m)   Wt 156 lb (70.8 kg)   BMI 26.78 kg/m?  ? ? ?  06/23/2021  ?  1:58 PM 07/23/2021  ?  2:21 PM  ?[place] Adult Exercise  ?Frequency of aerobic exercise (# of days/week) 7 7  ?Average time in minutes 45 45  ?Frequency of strengthening activities (# of days/week) 4 4  ? ? ?   ? View : No data to display.  ?  ?  ?  ? ? ?    ?Objective:  ?Physical Exam: ? ?Gen: NAD, comfortable in exam room ? ?Left foot/ankle: ?Mild pes planus.  No splaying, transverse arch collapse ?FROM digits, ankle without pain.  ?Mild TTP dorsally 3rd metatarsal, between 2nd and 3rd metatarsals. ?Negative ant drawer and negative talar tilt.   ?Negative metatarsal squeeze. ?Thompsons test negative. ?NV intact distally. ? ?Limited MSK u/s left foot: No cortical irregularity, edema overlying cortices of metatarsals.  No visible neuroma between 2nd and 3rd, 3rd and 4th MT heads.  ?Assessment & Plan:  ?1. Left foot pain - in runner.  Anatomy and ultrasound  not consistent with metatarsalgia or morton's neuroma.  Still related to stress reaction.  Sports insoles with metatarsal pad.  Icing, tylenol, ibuprofen if needed.  Wait at least 2-3 weeks before trying walk/jog.  F/u in 1 month. ?

## 2021-08-11 ENCOUNTER — Encounter: Payer: Self-pay | Admitting: Family Medicine

## 2021-08-20 ENCOUNTER — Ambulatory Visit: Payer: 59 | Admitting: Family Medicine

## 2021-08-20 ENCOUNTER — Encounter: Payer: Self-pay | Admitting: Family Medicine

## 2021-08-20 VITALS — BP 122/80 | Ht 64.0 in | Wt 158.0 lb

## 2021-08-20 DIAGNOSIS — M79672 Pain in left foot: Secondary | ICD-10-CM | POA: Diagnosis not present

## 2021-08-20 NOTE — Patient Instructions (Signed)
Keep up the progression - you're doing great! ?If the pain on the outside of your foot worsens we can add a fifth ray post but I don't think this is necessary. ?Message or call me if you have any issues otherwise follow up as needed. ?The metatarsal cookies should be changed every 3-4 months. ?

## 2021-08-20 NOTE — Progress Notes (Signed)
PCP: Pcp, No ? ?Subjective:  ? ?HPI: ?Patient is a 46 y.o. female here for left foot pain. ? ?3/20: ?Patient is an avid runner. ?Had been running about 20 miles a week before current pain started 2 weeks ago. ?Ran 11 miles 2 weeks ago and started to notice pain dorsolateral left foot. ?Pain worse with supination. ?No swelling or bruising. ?Tried KT taping, icing, ibuprofen. ?Has history of likely remote stress fracture. ?Tried to run again on Saturday and felt worse so stopped. ? ?4/19: ?Patient reports her pain is about the same as last visit. ?She has been able to walk ok. ?Took 1.5 weeks off before her race but still unable to run more than a few miles before she stopped and walked back to her car. ?Then took another 2 weeks off and did walk/run on Sunday but pain still bothering her dorsal left foot. ?Feels about 2nd and 3rd metatarsals. ?Saw chiropractor and pain more localized between the metatarsals at that visit. ? ?5/17: ?Patient reports switch to metatarsal cookie was helpful. ?Feels no pain in plantar or dorsal left foot currently. ?She did turn ankle a little and noted lateral left foot pain which has continued to give her a mild soreness. ?She has started back doing run/walk for 20-30 minutes past week and a half a couple times and did a 5k this weekend. ?She felt some radiation of tightness anterior foot up ankle along with the mild soreness lateral foot. ?No swelling, bruising. ? ? ?Past Medical History:  ?Diagnosis Date  ? Anemia   ? /w preg.   ? GERD (gastroesophageal reflux disease)   ? Headache(784.0)   ? migraines - excedrin migraine & imitrex- as needed    ? ? ?Current Outpatient Medications on File Prior to Visit  ?Medication Sig Dispense Refill  ? DEXILANT 60 MG capsule Take 60 mg by mouth daily.  1  ? EMGALITY 120 MG/ML SOAJ INJECT '240MG'$  [place] FOR LOADING DOSE THEN '120MG'$  ONCE A MONTH  11  ? etonogestrel-ethinyl estradiol (NUVARING) 0.12-0.015 MG/24HR vaginal ring Place 1 each vaginally every 28  (twenty-eight) days. Insert vaginally and leave in place for 3 consecutive weeks, then remove for 1 week.    ? Multiple Vitamins-Minerals (MULTIVITAMINS) CHEW Chew 1 tablet by mouth daily.    ? pantoprazole (PROTONIX) 40 MG tablet Take 40 mg by mouth 2 (two) times daily.    ? ?No current facility-administered medications on file prior to visit.  ? ? ?Past Surgical History:  ?Procedure Laterality Date  ? LARYNGOSCOPY  03/18/2012  ? Procedure: LARYNGOSCOPY;  Surgeon: Melida Quitter, MD;  Location: [facility];  Service: ENT;  Laterality: N/A;  Micro direct laryngoscopy with bilateral radiesse injections/jet venturi ventilation  ? NASAL ENDOSCOPY    ? VAGINAL DELIVERY    ? 2006 & 2009  ? ? ?No Known Allergies ? ?BP 122/80   Ht '5\' 4"'$  (1.626 m)   Wt 158 lb (71.7 kg)   BMI 27.12 kg/m?  ? ? ?  06/23/2021  ?  1:58 PM 07/23/2021  ?  2:21 PM  ?[place] Adult Exercise  ?Frequency of aerobic exercise (# of days/week) 7 7  ?Average time in minutes 45 45  ?Frequency of strengthening activities (# of days/week) 4 4  ? ? ?   ? View : No data to display.  ?  ?  ?  ? ? ?    ?Objective:  ?Physical Exam: ? ?Gen: NAD, comfortable in exam room ? ?Left foot/ankle: ?Mild  pes planus.  No other gross deformity, swelling, ecchymoses ?FROM digits, ankle without pain ?No TTP currently ?NV intact distally. ? ?Assessment & Plan:  ?1. Left foot pain - 2/2 stress reaction initially that has healed.  Doing well with sports insoles with metatarsal cookie - advised replace every 3-4 months.  For soreness on lateral foot believe this will continue to improve and exam is reassuring - consider 5th ray post if it doesn't continue to improve.  F/u prn. ?

## 2021-09-03 ENCOUNTER — Ambulatory Visit: Payer: 59 | Admitting: Family Medicine
